# Patient Record
Sex: Male | Born: 1995 | Race: Black or African American | Hispanic: No | Marital: Single | Smoking: Never smoker
Health system: Southern US, Community
[De-identification: ages and names within clinical notes are randomized; demographics above are authoritative.]

## PROBLEM LIST (undated history)

## (undated) DIAGNOSIS — J45909 Unspecified asthma, uncomplicated: Secondary | ICD-10-CM

## (undated) HISTORY — PX: NO PAST SURGERIES: SHX2092

## (undated) HISTORY — DX: Unspecified asthma, uncomplicated: J45.909

---
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
## 2016-01-14 ENCOUNTER — Other Ambulatory Visit: Payer: Self-pay | Admitting: Allergy and Immunology

## 2016-05-11 ENCOUNTER — Other Ambulatory Visit: Payer: Self-pay | Admitting: Allergy and Immunology

## 2016-08-11 ENCOUNTER — Other Ambulatory Visit: Payer: Self-pay

## 2016-09-15 ENCOUNTER — Other Ambulatory Visit: Payer: Self-pay | Admitting: *Deleted

## 2016-09-15 MED ORDER — MONTELUKAST SODIUM 10 MG PO TABS: ORAL_TABLET | ORAL | 0 refills | Status: DC

## 2016-09-22 ENCOUNTER — Ambulatory Visit: Payer: Self-pay | Admitting: Allergy and Immunology

## 2016-10-25 ENCOUNTER — Other Ambulatory Visit: Payer: Self-pay | Admitting: Allergy & Immunology

## 2016-11-06 ENCOUNTER — Ambulatory Visit (INDEPENDENT_AMBULATORY_CARE_PROVIDER_SITE_OTHER): Payer: BC Managed Care – PPO | Admitting: Allergy & Immunology

## 2016-11-06 ENCOUNTER — Encounter: Payer: Self-pay | Admitting: Allergy & Immunology

## 2016-11-06 ENCOUNTER — Encounter (INDEPENDENT_AMBULATORY_CARE_PROVIDER_SITE_OTHER): Payer: Self-pay

## 2016-11-06 VITALS — BP 122/78 | HR 84 | Temp 98.1°F | Resp 14 | Ht 72.5 in | Wt 234.0 lb

## 2016-11-06 DIAGNOSIS — J4530 Mild persistent asthma, uncomplicated: Secondary | ICD-10-CM | POA: Diagnosis not present

## 2016-11-06 DIAGNOSIS — J302 Other seasonal allergic rhinitis: Secondary | ICD-10-CM

## 2016-11-06 DIAGNOSIS — Z23 Encounter for immunization: Secondary | ICD-10-CM

## 2016-11-06 NOTE — Progress Notes (Signed)
FOLLOW UP  Date of Service/Encounter:  11/06/16   Assessment:   Mild persistent asthma, uncomplicated  Other seasonal allergic rhinitis  Encounter for immunization   Asthma Reportables:  Severity: mild persistent  Risk: low Control: well controlled  Seasonal Influenza Vaccine: yes (received in clinic today)  Plan/Recommendations:   1. Mild persistent asthma, uncomplicated - Lung testing was normal today. - We will not make any medication changes since you are doing so well.  - Daily controller medication(s): Qvar two puffs once daily with spacer or three-finger technique - Rescue medications: ProAir 4 puffs every 4-6 hours as needed - Changes during respiratory infections or worsening symptoms: increase Qvar to 4 puffs twice daily for TWO WEEKS. - Asthma control goals:  * Full participation in all desired activities (may need albuterol before activity) * Albuterol use two time or less a week on average (not counting use with activity) * Cough interfering with sleep two time or less a month * Oral steroids no more than once a year3 * No hospitalizations  2. Seasonal allergic rhinitis - Continue with cetirizine daily.  - We will get blood work to look for evidence of allergies in anticipation of starting shots. - We will make a final decision about starting shots once we get your blood work back. - Patient will discuss this with his parents as well since he is still on their insurance.  3. Return in about 6 months (around 05/06/2017).   Subjective:   Guy Powell is a 21 y.o. male presenting today for follow up of  Chief Complaint  Patient presents with  . Asthma    no flare ups. no rescue inhaler in about a year.     Guy Powell has a history of the following: Patient Active Problem List   Diagnosis Date Noted  . Mild persistent asthma, uncomplicated 11/06/2016  . Other seasonal allergic rhinitis 11/06/2016    History obtained from: chart  review and patient.  Guy Powell was referred by No PCP Per Patient.     Guy Powell is a 21 y.o. male presenting for a follow up visit. He was last seen in August 2016 by Dr. Willa Powell, who has since left the practice. At that time, the goal was to decrease his Qvar as he starts college. He was supposed to follow up in January 2017 but follows up now 18 months later.  Currently he is doing well. He is currently using Singulair 10mg  nightly and Qvar two puffs once daily. He is not using a spacer at this time. He does have a rescuie inhaler tbut has not used it in several years. He does feel it when he does not use his Qvar. He uses Zyrtec daily for his allergies and Mucinex when it gets particularly bad. He does not use nasla spray at this time but has been on one in the past. He did not feel that it helped much and he did not like taking it. He has had no ED or UC visits for asthma and has required no prednisone.   Otherwise, there have been no changes to his past medical history, surgical history, family history, or social history. 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 He is a recipient of the [employer], in which he gets all four years of his college paid for entirely.     Review of Systems: a 14-point review of  systems is pertinent for what is mentioned in HPI.  Otherwise, all other systems were negative. Constitutional: negative other than that listed in the HPI Eyes: negative other than that listed in the HPI Ears, nose, mouth, throat, and face: negative other than that listed in the HPI Respiratory: negative other than that listed in the HPI Cardiovascular: negative other than that listed in the HPI Gastrointestinal: negative other than that listed in the HPI Genitourinary: negative other than that listed in the HPI Integument: negative other than that listed in the HPI Hematologic: negative other than that listed in  the HPI Musculoskeletal: negative other than that listed in the HPI Neurological: negative other than that listed in the HPI Allergy/Immunologic: negative other than that listed in the HPI    Objective:   Blood pressure 122/78, pulse 84, temperature 98.1 F (36.7 C), temperature source Oral, resp. rate 14, height 6' 0.5" (1.842 m), weight 234 lb (106.1 kg), SpO2 96 %. Body mass index is 31.3 kg/m.   Physical Exam:  General: Alert, interactive, in no acute distress. Cooperative with the exam.  Eyes: No conjunctival injection present on the right, No conjunctival injection present on the left, PERRL bilaterally, No discharge on the right, No discharge on the left and No Horner-Trantas dots present Ears: Right TM pearly gray with normal light reflex, Left TM pearly gray with normal light reflex, Right TM intact without perforation and Left TM intact without perforation.  Nose/Throat: External nose within normal limits, nasal crease present and septum midline, turbinates edematous without discharge, post-pharynx mildly erythematous without cobblestoning in the posterior oropharynx. Tonsils 2+ without exudates Neck: Supple without thyromegaly. Lungs: Clear to auscultation without wheezing, rhonchi or rales. No increased work of breathing. CV: Normal S1/S2, no murmurs. Capillary refill <2 seconds.  Skin: Warm and dry, without lesions or rashes. Neuro:   Grossly intact. No focal deficits appreciated. Responsive to questions.   Diagnostic studies:  Spirometry: results normal (FEV1: 3.73/94%, FVC: 4.46/97%, FEV1/FVC: 83%).    Spirometry consistent with normal pattern.  Allergy Studies: None     Guy BondsJoel Jalonda Antigua, MD [facility]

## 2016-11-06 NOTE — Patient Instructions (Addendum)
1. Mild persistent asthma, uncomplicated - Lung testing was normal today. - We will not make any medication changes since you are doing so well.  - Daily controller medication(s): Qvar two puffs once daily with spacer or three-finger technique - Rescue medications: ProAir 4 puffs every 4-6 hours as needed - Changes during respiratory infections or worsening symptoms: increase Qvar to 4 puffs twice daily for TWO WEEKS. - Asthma control goals:  * Full participation in all desired activities (may need albuterol before activity) * Albuterol use two time or less a week on average (not counting use with activity) * Cough interfering with sleep two time or less a month * Oral steroids no more than once a year3 * No hospitalizations  2. Seasonal allergic rhinitis - Continue with cetirizine daily.  - We will get blood work to look for evidence of allergies in anticipation of starting shots. - We will make a final decision about starting shots once we get your blood work back.  3. Return in about 6 months (around 05/06/2017).  Please inform us of any Emergency Department visits, hospitalizations, or changes in symptoms. Call us before going to the ED for breathing or allergy symptoms since we might be able to fit you in for a sick visit. Feel free to contact us anytime with any questions, problems, or concerns.  It was a pleasure to meet you and your family today! Best wishes in the [place] Year!   Websites that have reliable patient information: 1. American Academy of Asthma, Allergy, and Immunology: www.aaaai.org 2. Food Allergy Research and Education (FARE): foodallergy.org 3. Mothers of Asthmatics: http://www.asthmacommunitynetwork.org 4. American College of Allergy, Asthma, and Immunology: www.acaai.org  Influenza (Flu) Vaccine (Inactivated or Recombinant): What You Need to Know  1. Why get vaccinated? Influenza ("flu") is a contagious disease that spreads around the [place]  every year, usually between October and May. Flu is caused by influenza viruses, and is spread mainly by coughing, sneezing, and close contact. Anyone can get flu. Flu strikes suddenly and can last several days. Symptoms vary by age, but can include:  fever/chills  sore throat  muscle aches  fatigue  cough  headache  runny or stuffy nose Flu can also lead to pneumonia and blood infections, and cause diarrhea and seizures in children. If you have a medical condition, such as heart or lung disease, flu can make it worse. Flu is more dangerous for some people. Infants and young children, people 80 years of age and older, pregnant women, and people with certain health conditions or a weakened immune system are at greatest risk. Each year thousands of people in the [place] die from flu, and many more are hospitalized. Flu vaccine can:  keep you from getting flu,  make flu less severe if you do get it, and  keep you from spreading flu to your family and other people. 2. Inactivated and recombinant flu vaccines A dose of flu vaccine is recommended every flu season. Children 6 months through 61 years of age may need two doses during the same flu season. Everyone else needs only one dose each flu season. Some inactivated flu vaccines contain a very small amount of a mercury-based preservative called thimerosal. Studies have not shown thimerosal in vaccines to be harmful, but flu vaccines that do not contain thimerosal are available. There is no live flu virus in flu shots. They cannot cause the flu. There are many flu viruses, and they are always changing. Each year  a new flu vaccine is made to protect against three or four viruses that are likely to cause disease in the upcoming flu season. But even when the vaccine doesn't exactly match these viruses, it may still provide some protection. Flu vaccine cannot prevent:  flu that is caused by a virus not covered by the vaccine,  or  illnesses that look like flu but are not. It takes about 2 weeks for protection to develop after vaccination, and protection lasts through the flu season. 3. Some people should not get this vaccine Tell the person who is giving you the vaccine:  If you have any severe, life-threatening allergies. If you ever had a life-threatening allergic reaction after a dose of flu vaccine, or have a severe allergy to any part of this vaccine, you may be advised not to get vaccinated. Most, but not all, types of flu vaccine contain a small amount of egg protein.  If you ever had Guillain-Barr Syndrome (also called GBS). Some people with a history of GBS should not get this vaccine. This should be discussed with your doctor.  If you are not feeling well. It is usually okay to get flu vaccine when you have a mild illness, but you might be asked to come back when you feel better. 4. Risks of a vaccine reaction With any medicine, including vaccines, there is a chance of reactions. These are usually mild and go away on their own, but serious reactions are also possible. Most people who get a flu shot do not have any problems with it. Minor problems following a flu shot include:  soreness, redness, or swelling where the shot was given  hoarseness  sore, red or itchy eyes  cough  fever  aches  headache  itching  fatigue If these problems occur, they usually begin soon after the shot and last 1 or 2 days. More serious problems following a flu shot can include the following:  There may be a small increased risk of Guillain-Barre Syndrome (GBS) after inactivated flu vaccine. This risk has been estimated at 1 or 2 additional cases per million people vaccinated. This is much lower than the risk of severe complications from flu, which can be prevented by flu vaccine.  Young children who get the flu shot along with pneumococcal vaccine (PCV13) and/or DTaP vaccine at the same time might be slightly  more likely to have a seizure caused by fever. Ask your doctor for more information. Tell your doctor if a child who is getting flu vaccine has ever had a seizure. Problems that could happen after any injected vaccine:  People sometimes faint after a medical procedure, including vaccination. Sitting or lying down for about 15 minutes can help prevent fainting, and injuries caused by a fall. Tell your doctor if you feel dizzy, or have vision changes or ringing in the ears.  Some people get severe pain in the shoulder and have difficulty moving the arm where a shot was given. This happens very rarely.  Any medication can cause a severe allergic reaction. Such reactions from a vaccine are very rare, estimated at about 1 in a million doses, and would happen within a few minutes to a few hours after the vaccination. As with any medicine, there is a very remote chance of a vaccine causing a serious injury or death. The safety of vaccines is always being monitored. For more information, visit: http://floyd.org/www.cdc.gov/vaccinesafety/ 5. What if there is a serious reaction? What should I look for? Look for  anything that concerns you, such as signs of a severe allergic reaction, very high fever, or unusual behavior. Signs of a severe allergic reaction can include hives, swelling of the face and throat, difficulty breathing, a fast heartbeat, dizziness, and weakness. These would start a few minutes to a few hours after the vaccination. What should I do?  If you think it is a severe allergic reaction or other emergency that can't wait, call 9-1-1 and get the person to the nearest hospital. Otherwise, call your doctor.  Reactions should be reported to the Vaccine Adverse Event Reporting System (VAERS). Your doctor should file this report, or you can do it yourself through the VAERS web site at www.vaers.LAgents.no, or by calling 1-(902)254-7521.  VAERS does not give medical advice. 6. The National Vaccine Injury Compensation  Program The [employer] Vaccine Injury Compensation Program (VICP) is a federal program that was created to compensate people who may have been injured by certain vaccines. Persons who believe they may have been injured by a vaccine can learn about the program and about filing a claim by calling 1-(443)802-7586 or visiting the VICP website at SpiritualWord.at. There is a time limit to file a claim for compensation. 7. How can I learn more?  Ask your healthcare provider. He or she can give you the vaccine package insert or suggest other sources of information.  Call your local or state health department.  Contact the Centers for Disease Control and Prevention (CDC):  Call 305-611-3119 (1-800-CDC-INFO) or  Visit CDC's website at BiotechRoom.com.cy Vaccine Information Statement, Inactivated Influenza Vaccine (06/08/2014) This information is not intended to replace advice given to you by your health care provider. Make sure you discuss any questions you have with your health care provider. Document Released: 08/13/2006 Document Revised: 07/09/2016 Document Reviewed: 07/09/2016 Elsevier Interactive Patient Education  2017 [employer].

## 2016-11-08 ENCOUNTER — Other Ambulatory Visit: Payer: Self-pay | Admitting: Allergy & Immunology

## 2017-05-14 ENCOUNTER — Other Ambulatory Visit: Payer: Self-pay | Admitting: Allergy & Immunology

## 2017-06-18 ENCOUNTER — Other Ambulatory Visit: Payer: Self-pay | Admitting: Allergy & Immunology

## 2017-06-18 NOTE — Telephone Encounter (Signed)
I gave 1 refill for Montelukast with no refills. Patient needs office visit for further refills.

## 2017-07-23 ENCOUNTER — Other Ambulatory Visit: Payer: Self-pay | Admitting: Allergy & Immunology

## 2017-11-20 ENCOUNTER — Other Ambulatory Visit: Payer: Self-pay | Admitting: Allergy & Immunology

## 2017-11-22 NOTE — Telephone Encounter (Signed)
Courtesy refill  

## 2018-01-01 ENCOUNTER — Other Ambulatory Visit: Payer: Self-pay | Admitting: Allergy & Immunology

## 2018-01-10 ENCOUNTER — Other Ambulatory Visit: Payer: Self-pay

## 2018-03-12 ENCOUNTER — Other Ambulatory Visit: Payer: Self-pay | Admitting: Allergy & Immunology

## 2018-03-24 ENCOUNTER — Other Ambulatory Visit: Payer: Self-pay | Admitting: *Deleted

## 2018-03-24 ENCOUNTER — Encounter: Payer: Self-pay | Admitting: Allergy & Immunology

## 2018-03-24 ENCOUNTER — Ambulatory Visit: Payer: BC Managed Care – PPO | Admitting: Allergy & Immunology

## 2018-03-24 VITALS — BP 128/70 | HR 55 | Temp 98.2°F | Resp 18 | Ht 72.5 in | Wt 225.0 lb

## 2018-03-24 DIAGNOSIS — J302 Other seasonal allergic rhinitis: Secondary | ICD-10-CM | POA: Diagnosis not present

## 2018-03-24 DIAGNOSIS — J4530 Mild persistent asthma, uncomplicated: Secondary | ICD-10-CM

## 2018-03-24 MED ORDER — BECLOMETHASONE DIPROP HFA 80 MCG/ACT IN AERB: INHALATION_SPRAY | RESPIRATORY_TRACT | 5 refills | Status: DC

## 2018-03-24 MED ORDER — MONTELUKAST SODIUM 10 MG PO TABS: 10.0000 mg | ORAL_TABLET | Freq: Every day | ORAL | 5 refills | Status: DC

## 2018-03-24 NOTE — Progress Notes (Signed)
FOLLOW UP  Date of Service/Encounter:  03/24/18   Assessment:   Mild persistent asthma, uncomplicated  Seasonal allergic rhinitis  Plan/Recommendations:   1. Mild persistent asthma, uncomplicated - Lung testing was normal today. - We will not make any medication changes since you are doing so well.  - Daily controller medication(s): Qvar two puffs once daily  - Rescue medications: ProAir 4 puffs every 4-6 hours as needed - Changes during respiratory infections or worsening symptoms: increase Qvar to 4 puffs twice daily for TWO WEEKS. - Asthma control goals:  * Full participation in all desired activities (may need albuterol before activity) * Albuterol use two time or less a week on average (not counting use with activity) * Cough interfering with sleep two time or less a month * Oral steroids no more than once a year3 * No hospitalizations  2. Seasonal allergic rhinitis - Continue with cetirizine daily.  - Continue with Nasonex as needed.   3. Return in about 6 months (around 09/24/2018).   Subjective:   Guy Powell is a 22 y.o. male presenting today for follow up of  Chief Complaint  Patient presents with  . Follow-up    Refills    Guy Powell has a history of the following: Patient Active Problem List   Diagnosis Date Noted  . Mild persistent asthma, uncomplicated 11/06/2016  . Other seasonal allergic rhinitis 11/06/2016    History obtained from: chart review and patient.  [name] Primary Care Provider is Patient, No Pcp Per.     Guy Powell is a 22 y.o. male presenting for a follow up visit.   Since the last visit, he has done well. He remains on Qvar two puffs once daily. He is supposed to be used daily. He is not using the ProAir regularly. He can tell a difference when he goes a few weeks without the Qvar. His worst seasons at the spring and the fall. He is unsure of the price of his Qvar and truly thinks that he does need it. He is  unaware that Qvar changed their delivery device around 18 months ago and is still using one of the older MDI models.   Aven's asthma has been well controlled. He has not required rescue medication, experienced nocturnal awakenings due to lower respiratory symptoms, nor have activities of daily living been limited. He has required no Emergency Department or Urgent Care visits for his asthma. He has required zero courses of systemic steroids for asthma exacerbations since the last visit. ACT score today is 25, indicating excellent asthma symptom control.   He was never on shots in the past, but his mother was pushing for it at the last visit. He is not interested in this at this time. He has not been tested since he was [age] or so. But he does fine with the nose spray and the antihistamine. He rarely gets sinus infections.   He is getting a degree with health system management. He is going to get an MHA. He is planning on staying in [place]. 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 Otherwise, there have been no changes to his past medical history, surgical history, family history, or social history.    Review of Systems: a 14-point review of systems is pertinent for what is mentioned in HPI.  Otherwise, all other systems were negative. Constitutional: negative other than that listed in the HPI Eyes: negative other than that  listed in the HPI Ears, nose, mouth, throat, and face: negative other than that listed in the HPI Respiratory: negative other than that listed in the HPI Cardiovascular: negative other than that listed in the HPI Gastrointestinal: negative other than that listed in the HPI Genitourinary: negative other than that listed in the HPI Integument: negative other than that listed in the HPI Hematologic: negative other than that listed in the HPI Musculoskeletal: negative other than that listed in the HPI Neurological: negative other than  that listed in the HPI Allergy/Immunologic: negative other than that listed in the HPI    Objective:   Blood pressure 128/70, pulse (!) 55, temperature 98.2 F (36.8 C), temperature source Oral, resp. rate 18, height 6' 0.5" (1.842 m), weight 225 lb (102.1 kg), SpO2 97 %. Body mass index is 30.1 kg/m.   Physical Exam:  General: Alert, interactive, in no acute distress. Pleasant male. 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  Eyes: No conjunctival injection bilaterally, no discharge on the right, no discharge on the left and no Horner-Trantas dots present. PERRL bilaterally. EOMI without pain. No photophobia.  Ears: Right TM pearly gray with normal light reflex, Left TM pearly gray with normal light reflex, Right TM intact without perforation and Left TM intact without perforation.  Nose/Throat: External nose within normal limits and septum midline. Turbinates edematous and pale with clear discharge. Posterior oropharynx erythematous without cobblestoning in the posterior oropharynx. Tonsils 2+ without exudates.  Tongue without thrush. Lungs: Clear to auscultation without wheezing, rhonchi or rales. No increased work of breathing. CV: Normal S1/S2. No murmurs. Capillary refill <2 seconds.  Skin: Warm and dry, without lesions or rashes. Neuro:   Grossly intact. No focal deficits appreciated. Responsive to questions.  Diagnostic studies:   Spirometry: results normal (FEV1: 4.21/99%, FVC: 4.80/95%, FEV1/FVC: 88%).    Spirometry consistent with normal pattern.   Allergy Studies: none       Malachi Bonds, MD  [facility]

## 2018-03-24 NOTE — Patient Instructions (Addendum)
1. Mild persistent asthma, uncomplicated - Lung testing was normal today. - We will not make any medication changes since you are doing so well.  - Daily controller medication(s): Qvar two puffs once daily  - Rescue medications: ProAir 4 puffs every 4-6 hours as needed - Changes during respiratory infections or worsening symptoms: increase Qvar to 4 puffs twice daily for TWO WEEKS. - Asthma control goals:  * Full participation in all desired activities (may need albuterol before activity) * Albuterol use two time or less a week on average (not counting use with activity) * Cough interfering with sleep two time or less a month * Oral steroids no more than once a year3 * No hospitalizations  2. Seasonal allergic rhinitis - Continue with cetirizine daily.  - Continue with Nasonex as needed.   3. Return in about 6 months (around 09/24/2018).   Please inform us of any Emergency Department visits, hospitalizations, or changes in symptoms. Call us before going to the ED for breathing or allergy symptoms since we might be able to fit you in for a sick visit. Feel free to contact us anytime with any questions, problems, or concerns.  It was a pleasure to see you again today!  Websites that have reliable patient information: 1. American Academy of Asthma, Allergy, and Immunology: www.aaaai.org 2. Food Allergy Research and Education (FARE): foodallergy.org 3. Mothers of Asthmatics: http://www.asthmacommunitynetwork.org 4. American College of Allergy, Asthma, and Immunology: MissingWeapons.ca   Make sure you are registered to vote!

## 2018-09-19 ENCOUNTER — Other Ambulatory Visit: Payer: Self-pay | Admitting: Allergy & Immunology

## 2018-09-28 ENCOUNTER — Ambulatory Visit: Payer: BC Managed Care – PPO | Admitting: Allergy & Immunology

## 2018-09-28 DIAGNOSIS — J309 Allergic rhinitis, unspecified: Secondary | ICD-10-CM

## 2018-12-12 ENCOUNTER — Other Ambulatory Visit: Payer: Self-pay | Admitting: Allergy & Immunology

## 2019-05-08 ENCOUNTER — Telehealth: Payer: Self-pay | Admitting: *Deleted

## 2019-05-08 NOTE — Telephone Encounter (Signed)
Denied refill for Qvar. Patient needs office visit.

## 2019-07-27 ENCOUNTER — Encounter: Payer: Self-pay | Admitting: Allergy & Immunology

## 2019-07-27 ENCOUNTER — Other Ambulatory Visit: Payer: Self-pay

## 2019-07-27 ENCOUNTER — Ambulatory Visit (INDEPENDENT_AMBULATORY_CARE_PROVIDER_SITE_OTHER): Payer: BC Managed Care – PPO | Admitting: Allergy & Immunology

## 2019-07-27 VITALS — BP 132/80 | HR 58 | Temp 98.0°F | Resp 16 | Ht 73.0 in | Wt 240.8 lb

## 2019-07-27 DIAGNOSIS — J4530 Mild persistent asthma, uncomplicated: Secondary | ICD-10-CM

## 2019-07-27 DIAGNOSIS — J302 Other seasonal allergic rhinitis: Secondary | ICD-10-CM

## 2019-07-27 NOTE — Patient Instructions (Addendum)
1. Mild persistent asthma, uncomplicated - Lung testing was normal today. - Everything seems to be stable, so we are not going make any changes at this time.  - Daily controller medication(s): Qvar 82mcg two puffs once daily + Singulair (montelukast) 10mg  - Rescue medications: ProAir 4 puffs every 4-6 hours as needed - Changes during respiratory infections or worsening symptoms: increase Qvar 16mcg to 4 puffs twice daily for TWO WEEKS. - Asthma control goals:  * Full participation in all desired activities (may need albuterol before activity) * Albuterol use two time or less a week on average (not counting use with activity) * Cough interfering with sleep two time or less a month * Oral steroids no more than once a year * No hospitalizations  2. Seasonal allergic rhinitis - Continue with cetirizine daily as needed. - Continue with Nasonex one spray per nostril daily as needed.   3. Return in about 6 months (around 01/24/2020). This can be an in-person, a virtual Webex or a telephone follow up visit.   Please inform us of any Emergency Department visits, hospitalizations, or changes in symptoms. Call us before going to the ED for breathing or allergy symptoms since we might be able to fit you in for a sick visit. Feel free to contact us anytime with any questions, problems, or concerns.  It was a pleasure to see you again today!  Websites that have reliable patient information: 1. American Academy of Asthma, Allergy, and Immunology: www.aaaai.org 2. Food Allergy Research and Education (FARE): foodallergy.org 3. Mothers of Asthmatics: http://www.asthmacommunitynetwork.org 4. American College of Allergy, Asthma, and Immunology: www.acaai.org  "Like" [place] on Facebook and Instagram for our latest updates!      Make sure you are registered to vote! If you have moved or changed any of your contact information, you will need to get this updated before voting!  In some cases, you MAY be able  to register to vote online: [email or web address]    Voter ID laws are NOT going into effect for the General Election in November 2020! DO NOT let this stop you from exercising your right to vote!   Absentee voting is the SAFEST way to vote during the coronavirus pandemic!   Download and print an absentee ballot request form at [email or web address] or you can scan the QR code below with your smart phone:      More information on absentee ballots can be found here: [email or web address]  You can request an absentee ballot online if you have a valid driver's license:  [email or web address]  [place] VOTING SITES have been established! You can register to vote and cast your vote on the same day at these locations. See this site for more information on what you need to register to vote: [email or web address]

## 2019-07-27 NOTE — Progress Notes (Signed)
FOLLOW UP  Date of Service/Encounter:  07/27/19   Assessment:   Mild persistent asthma, uncomplicated  Seasonal allergic rhinitis  Plan/Recommendations:   1. Mild persistent asthma, uncomplicated - Lung testing was normal today. - Everything seems to be stable, so we are not going make any changes at this time.  - Daily controller medication(s): Qvar 43mcg two puffs once daily + Singulair (montelukast) 10mg  - Rescue medications: ProAir 4 puffs every 4-6 hours as needed - Changes during respiratory infections or worsening symptoms: increase Qvar 5mcg to 4 puffs twice daily for TWO WEEKS. - Asthma control goals:  * Full participation in all desired activities (may need albuterol before activity) * Albuterol use two time or less a week on average (not counting use with activity) * Cough interfering with sleep two time or less a month * Oral steroids no more than once a year * No hospitalizations  2. Seasonal allergic rhinitis - Continue with cetirizine daily as needed. - Continue with Nasonex one spray per nostril daily as needed.   3. Return in about 6 months (around 01/24/2020). This can be an in-person, a virtual Webex or a telephone follow up visit.  Subjective:   Guy Powell is a 23 y.o. male presenting today for follow up of  Chief Complaint  Patient presents with  . Asthma  . Allergic Rhinitis     Guy Powell has a history of the following: Patient Active Problem List   Diagnosis Date Noted  . Mild persistent asthma, uncomplicated 19/37/9024  . Other seasonal allergic rhinitis 11/06/2016    History obtained from: chart review and patient.  Guy Powell is a 23 y.o. male presenting for a follow up visit.  He was last seen in May 2019.  At that time, his lung testing looked normal.  We did not make any medication changes.  We continued Qvar 80 mcg 2 puffs once daily with albuterol as needed.  For his seasonal allergic rhinitis, we continued with cetirizine and  Nasonex.  Asthma/Respiratory Symptom History: He is using his Qvar two puffs once daily. He is also on the montelukast. He does feel that the montelukast helps since he can feel it when he forgets to take it. He has not needed any ED visits or prednisone for his asthma. [name]'s asthma has been well controlled. He has not required rescue medication, experienced nocturnal awakenings due to lower respiratory symptoms, nor have activities of daily living been limited. He has required no Emergency Department or Urgent Care visits for his asthma. He has required zero courses of systemic steroids for asthma exacerbations since the last visit. ACT score today is 25, indicating excellent asthma symptom control.   Allergic Rhinitis Symptom History: He is not on cetirizine at this time. He has not used a nose spraay in a while. He has not needed any antibiotics in quite some time. She does endorse some postnasal drip. She has had some continued postnasal drip.  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 He is going to head to get a Masters in [employer] at some point. He is working remotely now. He eventually wants to work in the outpatient setting.   Otherwise, there have been no changes to his past medical history, surgical history, family history, or social history.    Review of Systems  Constitutional: Negative.  Negative for chills, fever, malaise/fatigue and weight loss.  HENT: Negative.  Negative for congestion, ear discharge, ear pain, nosebleeds, sinus pain and sore throat.  Eyes: Negative for pain, discharge and redness.  Respiratory: Negative for cough, sputum production, shortness of breath and wheezing.   Cardiovascular: Negative.  Negative for chest pain and palpitations.  Gastrointestinal: Negative for abdominal pain, constipation, diarrhea, heartburn, nausea and vomiting.  Skin: Negative.  Negative for itching and rash.  Neurological: Negative for dizziness and  headaches.  Endo/Heme/Allergies: Negative for environmental allergies. Does not bruise/bleed easily.       Objective:   Blood pressure 132/80, pulse (!) 58, temperature 98 F (36.7 C), temperature source Temporal, resp. rate 16, height 6\' 1"  (1.854 m), weight 240 lb 12.8 oz (109.2 kg), SpO2 98 %. Body mass index is 31.77 kg/m.   Physical Exam:  Physical Exam  Constitutional: He appears well-developed.  Pleasant male. Very talkative.   HENT:  Head: Normocephalic and atraumatic.  Right Ear: Tympanic membrane, external ear and ear canal normal.  Left Ear: Tympanic membrane and ear canal normal.  Nose: No mucosal edema, rhinorrhea, nasal deformity or septal deviation. No epistaxis. Right sinus exhibits no maxillary sinus tenderness and no frontal sinus tenderness. Left sinus exhibits no maxillary sinus tenderness and no frontal sinus tenderness.  Mouth/Throat: Uvula is midline and oropharynx is clear and moist. Mucous membranes are not pale and not dry.  Eyes: Pupils are equal, round, and reactive to light. Conjunctivae and EOM are normal. Right eye exhibits no chemosis and no discharge. Left eye exhibits no chemosis and no discharge. Right conjunctiva is not injected. Left conjunctiva is not injected.  Cardiovascular: Normal rate, regular rhythm and normal heart sounds.  Respiratory: Effort normal and breath sounds normal. No accessory muscle usage. No tachypnea. No respiratory distress. He has no wheezes. He has no rhonchi. He has no rales. He exhibits no tenderness.  Moving air well in all lung fields. No increased work of breathing.   Lymphadenopathy:    He has no cervical adenopathy.  Neurological: He is alert.  Skin: No abrasion, no petechiae and no rash noted. Rash is not papular, not vesicular and not urticarial. No erythema. No pallor.  No eczematous or urticarial lesions noted.   Psychiatric: He has a normal mood and affect.     Diagnostic studies:    Spirometry: results  normal (FEV1: 4.03/93%, FVC: 4.61/90%, FEV1/FVC: 87%).    Spirometry consistent with normal pattern.   Allergy Studies: none        10-14-2004, MD  [facility]

## 2019-08-01 ENCOUNTER — Other Ambulatory Visit: Payer: Self-pay

## 2019-08-01 MED ORDER — MONTELUKAST SODIUM 10 MG PO TABS: 10.0000 mg | ORAL_TABLET | Freq: Every day | ORAL | 1 refills | Status: DC

## 2019-09-01 ENCOUNTER — Other Ambulatory Visit: Payer: Self-pay

## 2019-09-01 MED ORDER — MONTELUKAST SODIUM 10 MG PO TABS: 10.0000 mg | ORAL_TABLET | Freq: Every day | ORAL | 1 refills | Status: DC

## 2019-09-01 MED ORDER — QVAR REDIHALER 80 MCG/ACT IN AERB: INHALATION_SPRAY | RESPIRATORY_TRACT | 5 refills | Status: DC

## 2020-02-22 ENCOUNTER — Other Ambulatory Visit: Payer: Self-pay | Admitting: Allergy & Immunology

## 2020-03-07 ENCOUNTER — Other Ambulatory Visit: Payer: Self-pay | Admitting: Allergy & Immunology

## 2020-03-29 ENCOUNTER — Other Ambulatory Visit: Payer: Self-pay | Admitting: Allergy & Immunology

## 2020-06-27 ENCOUNTER — Other Ambulatory Visit: Payer: Self-pay

## 2020-06-27 ENCOUNTER — Ambulatory Visit: Payer: BC Managed Care – PPO | Admitting: Allergy & Immunology

## 2020-06-27 ENCOUNTER — Encounter: Payer: Self-pay | Admitting: Allergy & Immunology

## 2020-06-27 VITALS — BP 122/72 | HR 55 | Temp 97.9°F | Resp 16 | Ht 73.0 in | Wt 253.8 lb

## 2020-06-27 DIAGNOSIS — J4530 Mild persistent asthma, uncomplicated: Secondary | ICD-10-CM

## 2020-06-27 DIAGNOSIS — J302 Other seasonal allergic rhinitis: Secondary | ICD-10-CM | POA: Diagnosis not present

## 2020-06-27 DIAGNOSIS — L2089 Other atopic dermatitis: Secondary | ICD-10-CM | POA: Diagnosis not present

## 2020-06-27 MED ORDER — QVAR REDIHALER 80 MCG/ACT IN AERB: INHALATION_SPRAY | RESPIRATORY_TRACT | 5 refills | Status: AC

## 2020-06-27 MED ORDER — MONTELUKAST SODIUM 10 MG PO TABS
ORAL_TABLET | ORAL | 0 refills | Status: DC
Start: 2020-06-27 — End: 2020-07-29

## 2020-06-27 MED ORDER — CETIRIZINE HCL 10 MG PO TABS: 10.0000 mg | ORAL_TABLET | Freq: Every day | ORAL | 5 refills | Status: DC | PRN

## 2020-06-27 NOTE — Patient Instructions (Addendum)
1. Mild persistent asthma, uncomplicated - Lung testing was normal today. - Everything seems to be stable, so we are not going make any changes at this time.  - Daily controller medication(s): Qvar two puffs once daily + Singulair (montelukast) 10mg  - Rescue medications: ProAir 4 puffs every 4-6 hours as needed - Changes during respiratory infections or worsening symptoms: increase Qvar to 4 puffs twice daily for TWO WEEKS. - Asthma control goals:  * Full participation in all desired activities (may need albuterol before activity) * Albuterol use two time or less a week on average (not counting use with activity) * Cough interfering with sleep two time or less a month * Oral steroids no more than once a year * No hospitalizations  2. Seasonal allergic rhinitis - Continue with cetirizine daily as needed.  3. Return in about 1 year (around 06/27/2021).    Please inform 06/29/2021 of any Emergency Department visits, hospitalizations, or changes in symptoms. Call us before going to the ED for breathing or allergy symptoms since we might be able to fit you in for a sick visit. Feel free to contact us anytime with any questions, problems, or concerns.  It was a pleasure to see you again today!  Websites that have reliable patient information: 1. American Academy of Asthma, Allergy, and Immunology: www.aaaai.org 2. Food Allergy Research and Education (FARE): foodallergy.org 3. Mothers of Asthmatics: http://www.asthmacommunitynetwork.org 4. American College of Allergy, Asthma, and Immunology: www.acaai.org   COVID-19 Vaccine Information can be found at: [place] For questions related to vaccine distribution or appointments, please email [email or web address] or call [phone].     "Like" [phone] on Facebook and Instagram for our latest updates!        Make sure you are registered to vote! If you have moved or  changed any of your contact information, you will need to get this updated before voting!  In some cases, you MAY be able to register to vote online: [place]

## 2020-06-27 NOTE — Progress Notes (Signed)
FOLLOW UP  Date of Service/Encounter:  06/27/20   Assessment:   Mild persistent asthma, uncomplicated  Seasonal allergic rhinitis  Eczema  Plan/Recommendations:   1. Mild persistent asthma, uncomplicated - Lung testing was normal today. - Everything seems to be stable, so we are not going make any changes at this time.  - Daily controller medication(s): Qvar two puffs once daily + Singulair (montelukast) 10mg  - Rescue medications: ProAir 4 puffs every 4-6 hours as needed - Changes during respiratory infections or worsening symptoms: increase Qvar to 4 puffs twice daily for TWO WEEKS. - Asthma control goals:  * Full participation in all desired activities (may need albuterol before activity) * Albuterol use two time or less a week on average (not counting use with activity) * Cough interfering with sleep two time or less a month * Oral steroids no more than once a year * No hospitalizations  2. Seasonal allergic rhinitis - Continue with cetirizine daily as needed.  3. Return in about 1 year (around 06/27/2021).   Subjective:   Guy Powell is a 24 y.o. male presenting today for follow up of  Chief Complaint  Patient presents with   Asthma    No concerns or issues at this time. Needs medication refills    Guy Powell has a history of the following: Patient Active Problem List   Diagnosis Date Noted   Mild persistent asthma, uncomplicated 11/06/2016   Other seasonal allergic rhinitis 11/06/2016    History obtained from: chart review and patient.  Guy Powell is a 24 y.o. male presenting for a follow up visit.  He was last seen in September 2020.  At that time, his lung testing looked normal.  Everything was stable, so we did not make any changes.  We continue with Qvar 80 mcg 2 puffs once daily in combination with Singulair 10 mg daily.  We also continued with cetirizine and Nasonex as needed for his environmental allergies.  Since last visit, he has  done very well. He is out of medications, unfortunately.   Asthma/Respiratory Symptom History: He has not had his medications a few weeks, and he has noticed that his symptoms have worsened.  He was on Qvar 80 mcg 2 puffs once daily as well as Singulair.  On that regimen, he was not using his albuterol inhaler much at all.  Allergic Rhinitis Symptom History: He has a nasal steroid, but he had never uses it.  Mostly just uses cetirizine.  He was never on allergen immunotherapy.  His last testing was done by Dr. October 2020 years ago.  Eczema Symptom History: He does have some dry skin in his antecubital fossa.  He does not use anything in particular for it.  He does moisturize and it seems to be a little bit better.  He does not want a topical steroid.   He has been working at [employer] since he graduated in May 2020.  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  He is moving there next Monday.  However, his mom still lives here in [place] and he would like to keep his care here.  Otherwise, there have been no changes to his past medical history, surgical history, family history, or social history.    Review of Systems  Constitutional: Negative.  Negative for fever, malaise/fatigue and weight loss.  HENT: Negative.  Negative for congestion, ear discharge and ear pain.   Eyes: Negative for  pain, discharge and redness.  Respiratory: Negative for cough, sputum production, shortness of breath and wheezing.   Cardiovascular: Negative.  Negative for chest pain and palpitations.  Gastrointestinal: Negative for abdominal pain and heartburn.  Skin: Negative.  Negative for itching and rash.  Neurological: Negative for dizziness and headaches.  Endo/Heme/Allergies: Negative for environmental allergies. Does not bruise/bleed easily.       Objective:   Blood pressure 122/72, pulse (!) 55, temperature 97.9 F (36.6 C), resp. rate 16, height 6'  1" (1.854 m), weight 253 lb 12.8 oz (115.1 kg), SpO2 100 %. Body mass index is 33.48 kg/m.   Physical Exam:  Physical Exam Constitutional:      Appearance: He is well-developed.  HENT:     Head: Normocephalic and atraumatic.     Right Ear: Tympanic membrane, ear canal and external ear normal.     Left Ear: Tympanic membrane and ear canal normal.     Nose: No nasal deformity, septal deviation, mucosal edema or rhinorrhea.     Right Sinus: No maxillary sinus tenderness or frontal sinus tenderness.     Left Sinus: No maxillary sinus tenderness or frontal sinus tenderness.     Mouth/Throat:     Mouth: Mucous membranes are not pale and not dry.     Pharynx: Uvula midline.  Eyes:     General:        Right eye: No discharge.        Left eye: No discharge.     Conjunctiva/sclera: Conjunctivae normal.     Right eye: Right conjunctiva is not injected. No chemosis.    Left eye: Left conjunctiva is not injected. No chemosis.    Pupils: Pupils are equal, round, and reactive to light.  Cardiovascular:     Rate and Rhythm: Normal rate and regular rhythm.     Heart sounds: Normal heart sounds.  Pulmonary:     Effort: Pulmonary effort is normal. No tachypnea, accessory muscle usage or respiratory distress.     Breath sounds: Normal breath sounds. No wheezing, rhonchi or rales.  Chest:     Chest wall: No tenderness.  Lymphadenopathy:     Cervical: No cervical adenopathy.  Skin:    Coloration: Skin is not pale.     Findings: No abrasion, erythema, petechiae or rash. Rash is not papular, urticarial or vesicular.  Neurological:     Mental Status: He is alert.      Diagnostic studies:    Spirometry: results normal (FEV1: 4.00/92%, FVC: 4.66/91%, FEV1/FVC: 86%).    Spirometry consistent with normal pattern.   Allergy Studies: none       Malachi Bonds, MD  [facility]

## 2020-07-27 ENCOUNTER — Other Ambulatory Visit: Payer: Self-pay | Admitting: Allergy & Immunology

## 2021-01-02 ENCOUNTER — Other Ambulatory Visit: Payer: Self-pay | Admitting: Allergy & Immunology

## 2021-03-12 ENCOUNTER — Other Ambulatory Visit: Payer: Self-pay | Admitting: Allergy & Immunology

## 2021-03-12 NOTE — Telephone Encounter (Signed)
Last seen Aug.of 2021 and is not due back for his yearly appointment until Aug. of 2022

## 2021-12-06 ENCOUNTER — Other Ambulatory Visit: Payer: Self-pay | Admitting: Allergy & Immunology

## 2022-01-02 ENCOUNTER — Other Ambulatory Visit: Payer: Self-pay | Admitting: Allergy & Immunology

## 2022-04-05 ENCOUNTER — Other Ambulatory Visit: Payer: Self-pay | Admitting: Allergy & Immunology
